# Patient Record
Sex: Female | Born: 1960
Health system: Southern US, Academic
[De-identification: ages and names within clinical notes are randomized; demographics above are authoritative.]

## PROBLEM LIST (undated history)

## (undated) ENCOUNTER — Encounter: Attending: Internal Medicine | Primary: Internal Medicine

## (undated) ENCOUNTER — Ambulatory Visit

## (undated) ENCOUNTER — Ambulatory Visit: Payer: PRIVATE HEALTH INSURANCE | Attending: Rheumatology | Primary: Rheumatology

## (undated) ENCOUNTER — Ambulatory Visit: Payer: PRIVATE HEALTH INSURANCE

## (undated) ENCOUNTER — Encounter: Attending: Rheumatology | Primary: Rheumatology

## (undated) ENCOUNTER — Encounter

## (undated) DIAGNOSIS — M810 Age-related osteoporosis without current pathological fracture: Secondary | ICD-10-CM

## (undated) DIAGNOSIS — M069 Rheumatoid arthritis, unspecified: Secondary | ICD-10-CM

## (undated) DIAGNOSIS — E119 Type 2 diabetes mellitus without complications: Secondary | ICD-10-CM

## (undated) DIAGNOSIS — G629 Polyneuropathy, unspecified: Secondary | ICD-10-CM

---
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
## 2004-04-05 ENCOUNTER — Ambulatory Visit (HOSPITAL_COMMUNITY): Admission: RE | Admit: 2004-04-05 | Discharge: 2004-04-05 | Payer: Self-pay | Admitting: Gynecology

## 2004-04-17 ENCOUNTER — Encounter (INDEPENDENT_AMBULATORY_CARE_PROVIDER_SITE_OTHER): Payer: Self-pay | Admitting: *Deleted

## 2004-04-17 ENCOUNTER — Inpatient Hospital Stay (HOSPITAL_COMMUNITY): Admission: RE | Admit: 2004-04-17 | Discharge: 2004-04-19 | Payer: Self-pay | Admitting: Gynecology

## 2004-05-30 ENCOUNTER — Ambulatory Visit (HOSPITAL_COMMUNITY): Admission: RE | Admit: 2004-05-30 | Discharge: 2004-05-31 | Payer: Self-pay | Admitting: *Deleted

## 2005-03-07 ENCOUNTER — Emergency Department: Payer: Self-pay | Admitting: Emergency Medicine

## 2005-03-08 ENCOUNTER — Ambulatory Visit: Payer: Self-pay | Admitting: Emergency Medicine

## 2007-06-28 ENCOUNTER — Ambulatory Visit: Payer: Self-pay | Admitting: Gynecology

## 2007-06-28 ENCOUNTER — Encounter: Admission: RE | Admit: 2007-06-28 | Discharge: 2007-06-28 | Payer: Self-pay | Admitting: Gynecology

## 2010-02-13 ENCOUNTER — Ambulatory Visit (INDEPENDENT_AMBULATORY_CARE_PROVIDER_SITE_OTHER): Payer: Self-pay | Admitting: Obstetrics & Gynecology

## 2010-02-13 DIAGNOSIS — N939 Abnormal uterine and vaginal bleeding, unspecified: Secondary | ICD-10-CM

## 2010-02-13 DIAGNOSIS — N926 Irregular menstruation, unspecified: Secondary | ICD-10-CM

## 2010-02-13 DIAGNOSIS — N949 Unspecified condition associated with female genital organs and menstrual cycle: Secondary | ICD-10-CM

## 2010-02-14 ENCOUNTER — Encounter (INDEPENDENT_AMBULATORY_CARE_PROVIDER_SITE_OTHER): Payer: Self-pay | Admitting: *Deleted

## 2010-03-29 NOTE — Assessment & Plan Note (Signed)
NAME:  Mary Cobb, Mary Cobb              ACCOUNT NO.:  [id number]  MEDICAL RECORD NO.:  [id number]           PATIENT TYPE:  LOCATION:  [facility] at [facility]           FACILITY:  PHYSICIAN:  Scheryl Darter, MD            DATE OF BIRTH:  DATE OF SERVICE:                                 CLINIC NOTE  HISTORY OF PRESENT ILLNESS:  The patient comes in today because of episode of vaginal bleeding and some pain.  The patient is a 50 year old white female gravida 2, para 2.  Last menstrual period was in 2006 at the time of a total abdominal hysterectomy, right salpingo-oophorectomy, appendectomy, Burch colposuspension.  Procedure was done by Dr. Mia Creek.  The patient states that yesterday she had bleeding like start of her period.  She has had some lower back pain for several days and some suprapubic cramping.  She did not think that this blood had come from her urine and she does not see any blood in her urine.  No sign of any blood in her stool.  No constipation or diarrhea.  No dysuria.  No history of kidney stones.  She has had no abnormal discharge.  She has medical history of Wolff-Parkinson-White syndrome.  MEDICATIONS:  None.  REVIEW OF SYSTEMS:  No bleeding today.  She still reports some cramping and lower back pain.  PHYSICAL EXAMINATION:  GENERAL:  The patient is in no acute distress. VITAL SIGNS:  Weight is 187 pounds, height 5 feet 3 inches, blood pressure 120/88, and pulse 78. ABDOMEN:  Moderately obese, soft, and nontender.  No mass. PELVIC:  She has no CVA tenderness. PELVIC:  External genitalia, she has mild atrophy.  She has erythema at the urethral meatus consistent with urethral prolapse.  There is minimal bulging of her bladder consistent with mild cystocele.  The cuff is well- supported and there are no lesions and no blood.  No pelvic masses or tenderness.  IMPRESSION:  Vaginal bleeding which may have been urinary in origin.  I did not find any vaginal source.  The  patient has had a hysterectomy.  PLAN:  We will obtain urine culture today.  She was seen at the Urgent Care yesterday.  She was told there was some blood in her urine, but she was told that this was normal for woman.  Like to evaluate her urinary tract, and possibility of kidney stones.  She ordered a CT urogram.  She will return after this has been performed.  She will report if she has more severe symptoms.     Scheryl Darter, MD    JA/MEDQ  D:  02/13/2010  T:  02/14/2010  Job:  [id number]

## 2010-05-21 NOTE — Assessment & Plan Note (Signed)
Mary Cobb, BOSCH              ACCOUNT NO.:  [id number]   MEDICAL RECORD NO.:  [id number]          PATIENT TYPE:  POB   LOCATION:  [facility] at [facility]         FACILITY:  [facility]   PHYSICIAN:  Ginger Carne, MD DATE OF BIRTH:  [birth year]   DATE OF SERVICE:                                  CLINIC NOTE   The patient returns today for routine gynecological evaluation.  She had  undergone a total abdominal hysterectomy, right salpingo-oophorectomy,  appendectomy, and a Burch colposuspension with preservation of left tube  and ovary in April 2006 because of chronic pelvic pain, endometriosis,  and genuine urinary stress incontinence.  The patient denies complaints  of pelvic pain or urinary stress incontinence symptomatology.  No  complaints of urgency or an overactive bladder.  The patient otherwise  denies any cardiovascular, genitourinary, or gastrointestinal  symptomatology.  Her past history is per office notes.   PHYSICAL EXAMINATION:  VITAL SIGNS:  Blood pressure 106/70, weight 187  pounds, and height 5 feet 3 inches.  HEENT:  Grossly normal.  BREASTS:  Without masses, discharge, thickenings, or tenderness.  CHEST:  Clear to percussion and auscultation.  CARDIOVASCULAR:  Without murmurs or enlargements.  EXTREMITIES:  Within normal limits.  LYMPHATICS:  Within normal limits.  SKIN:  Within normal limits.  NEUROLOGIC:  Within normal limits.  MUSCULOSKELETAL:  Within normal limits.  ABDOMEN:  Soft without gross hepatosplenomegaly.  PELVIC:  External genitalia; vulva and vagina are normal.  Cuff smooth.  Pap smear deferred.  Adnexa and bimanual exam revealed no tenderness or  masses.   IMPRESSION:  Normal gynecologic exam.   PLAN:  The patient had a mammogram this morning and at this time, blood  work will be deferred.  In addition, it should be noted the patient had  in the past been treated for hypertension.  At this time is currently on  no medications.     ______________________________  Ginger Carne, MD     SHB/MEDQ  D:  06/28/2007  T:  06/29/2007  Job:  [id number]

## 2010-05-24 NOTE — Discharge Summary (Signed)
Mary Cobb, Mary Cobb              ACCOUNT NO.:  [id number]   MEDICAL RECORD NO.:  [id number]          PATIENT TYPE:  INP   LOCATION:  9316                          FACILITY:  [facility]   PHYSICIAN:  Ginger Carne, MD  DATE OF BIRTH:  1960/07/09   DATE OF ADMISSION:  04/17/2004  DATE OF DISCHARGE:  04/19/2004                                 DISCHARGE SUMMARY   REASON FOR ADMISSION:  Chronic pelvic pain, endometriosis and genuine  urinary stress incontinence.   FINAL DIAGNOSES:  Stage 2 endometriosis, chronic pelvic pain and genuine  urinary stress incontinence.   IN HOSPITAL PROCEDURES:  Total abdominal hysterectomy, right salpingo-  oophorectomy, appendectomy, Burch colposuspension with preservation of left  tube and ovary.   HOSPITAL COURSE:  This patient is a 50 year old multiparous Caucasian female  who underwent the aforementioned procedures on the 12th of April 2006. The  patient's preoperative evaluation demonstrated Wolf-Parkinson-White syndrome  and was managed by Dr. Severiano Gilbert for cardiologic evaluation. Prior to her  surgery, a conference call with anesthesia myself confirmed the safety to  proceed with said surgery and the reassurance that there was less than a 1%  risk of patient developing a malignant or dangerous rhythm pattern. All  parties were comfortable to proceed with surgery. Intraoperatively, the  patient did well without evidence for heart rate abnormalities. Similarly  her postoperative course revealed no evidence of palpitations, chest pain or  heart rate abnormalities.   The patient's postoperative hemoglobin was 11.0 from a preop of 15.5 and  hematocrit of 31.3 from a preop of 43.8. Her calves were without tenderness  bilaterally, her incision was dry without drainage and her lungs were clear.  Her heart demonstrated no abnormal rate or rhythm patterns of concern. Her  Foley catheter was removed and at the time of this dictation she had voided  once and  will be followed for 2-3 more hours to assure that she has complete  voiding.   The patient's postoperative medications include:  1.  Percocet 5/325, 1-2 every 4-6 hours.  2.  Augmentin 500 mg twice a day.  3.  She will continue her home medications including Crestor 10 mg daily and      Hyzaar 50/12.5 mg daily.   She was explained to utilize timed voids every four hours for four weeks to  assure complete bladder emptying. She was advised to contact the office if  she has a temperature elevation above 100.4 degrees, incisional drainage,  redness or increasing pain, constipation that is not relieved with Biscadil  and/or magnesium citrate, increasing vaginal drainage, increasing pelvic or  abdominal pain or lower back pain or burning with urination. The patient  will return  to the office in six days to have her staples removed. All questions were  answered to the satisfaction of said patient. She was advised not to lift  more than 20 pounds for the next six weeks and to resume a regular diet. Her  status on discharge was excellent.      SHB/MEDQ  D:  04/19/2004  T:  04/19/2004  Job:  [id number]

## 2010-05-24 NOTE — H&P (Signed)
Mary Cobb, LIPPARD              ACCOUNT NO.:  [id number]   MEDICAL RECORD NO.:  [id number]          PATIENT TYPE:  INP   LOCATION:  NA                            FACILITY:  [facility]   PHYSICIAN:  Ginger Carne, MD  DATE OF BIRTH:  1960-05-16   DATE OF ADMISSION:  DATE OF DISCHARGE:                                HISTORY & PHYSICAL   DATE OF SCHEDULED SURGERY:  April 05, 2004   ADMITTING DIAGNOSIS:  Endometriosis, chronic pain, and genuine urinary  stress incontinence.   IN-HOSPITAL PROCEDURE:  Laparoscopic-assisted vaginal hysterectomy with  unilateral or bilateral salpingo-oophorectomy, tension-free vaginal tape  procedure, and cystoscopy.   HISTORY OF PRESENT ILLNESS:  This patient is a 50 year old gravida 2 para 2-  0-0-2 Caucasian female admitted for the aforementioned diagnoses. The  patient has had a several-year history of worsening pain with intercourse,  pelvic discomfort between her menses, as well as significant dysmenorrhea.  The patient states that the discomfort seems to not be abated by  nonsteroidal antiinflammatory agents. Her primary care physician has  utilized both narcotic analgesics as well as management with oral  contraceptives, but continues to have pain despite these methods. She has  been on continual oral contraceptives for 1 year without benefit. She has  had a laparoscopy in 1984 which at that time was told that she did have  endometriosis, although the staging is not available. This was performed at  the time that she had her laparoscopic tubal ligation.   The patient has no genitourinary, gastrointestinal, or musculoskeletal  sources for her pain. In addition, urinalysis is normal. The patient had  declined the use of Lupron Depot, Depo-Provera, Danocrine for management of  said discomfort medically.   Her menses are anywhere from 30 to 15 days apart, lasting 1 to 2-and-a-half  weeks at times. Transvaginal ultrasound revealed no evidence for  intracavitary lesions on a hysterosonogram. No evidence for fibroids noted  as well. Endometrial biopsy was negative for neoplasia or hyperplasia.   The patient loses urine with coughing, straining, and other Valsalva  maneuvers. Her history is negative for postvoid dribbling, nocturia,  increased frequency of urination, or straining to void. The patient has had  no previous bladder or urological surgery. The patient takes no medications  to enhance her loss of urine and has no chronic diseases that would  contribute to same. The patient denies fecal incontinence.   OBSTETRICAL AND GYNECOLOGICAL HISTORY:  The patient has had two vaginal  deliveries in 1980 and 1982 and a tubal ligation in 1984 by laparoscopic  approach. She has had cryosurgery in 1984 for dysplasia.   MEDICAL HISTORY:  Includes hypercholesterolemia and hypertension.   MEDICATIONS:  1.  Crestor 10 mg daily.  2.  Hyzaar 50/12.5 mg daily.   ALLERGIES:  None.   SURGICAL HISTORY:  Negative.   SOCIAL HISTORY:  Positive for smoking one-and-a-half packs of cigarettes  daily but denies alcohol or illicit drug abuse.   REVIEW OF SYSTEMS:  Negative.   FAMILY HISTORY:  Her father has hypertension and her mother has type 2  diabetes.  PHYSICAL EXAMINATION:  GENERAL:  This is a pleasant female in no acute  distress.  VITAL SIGNS:  Blood pressure is 122/80, height 5 feet 3 inches, weight 179  pounds.  HEENT:  Grossly normal.  CHEST:  Clear.  CARDIAC:  Without murmurs or enlargements.  BREASTS:  Without masses, discharge, thickenings, or tenderness.  CHEST:  Clear to percussion and auscultation.  CARDIOVASCULAR:  Without murmurs or enlargements, regular rate and rhythm.  EXTREMITY, LYMPHATIC, SKIN, NEUROLOGIC, MUSCULOSKELETAL:  All within normal  limits.  ABDOMEN:  Soft without gross hepatosplenomegaly.  PELVIC:  External genitalia, vulva, and vagina are normal. Cervix smooth  without erosions or lesions. There is  tenderness on palpation of the uterus,  which is normal is size. Both adnexa are tender.  RECTAL:  Hemoccult negative without masses.   Urinalysis is normal. The patient visibly demonstrates loss of urine with  coughing, straining on the table. The patient's in-office filling cystometry  revealed no evidence for an overactive bladder. Urinalysis is normal.   IMPRESSION:  1.  Chronic pelvic pain, dyspareunia, dysmenorrhea, consistent with      endometriosis.  2.  Genuine urinary stress incontinence.   PLAN:  1.  The patient was apprised of both medical and surgical options related to      the above procedures. The patient does not wish to partake in any      medical management for her pain. She has no desire for further      childbearing and feels that the pain has had a negative impact in her      quality of life. Her sex life has been significantly affected, the      patient states, as well. Ashby Dawes of said procedure discussed in detail.      She understands that it is possible that both ovaries will be removed if      warranted on the basis of visible endometriosis or significant adhesive      disease. However, if this is not necessary a laparoscopic-assisted      vaginal hysterectomy and a right salpingo-oophorectomy with preservation      left tube and ovary will be performed. An incidental appendectomy will      possibly also be performed as well. Risks including injuries to ureter,      bowel, and bladder; possible conversion to an open procedure; hemorrhage      possibly requiring a blood transfusion; infection; and unforeseen      complications were discussed and understood by said patient.  2.  The patient was fully apprised as to the nature of a tension-free      vaginal tape procedure. She had tried Kegel      maneuvers without benefit. She understands that the risks of the     procedure includes possible postoperative urinary retention, urgency,      failure of said  procedure to correct incontinence, bleeding, infection,      and possible graft rejection. All questions answered to the satisfaction      of said patient.      SHB/MEDQ  D:  04/03/2004  T:  04/03/2004  Job:  [id number]

## 2010-05-24 NOTE — Op Note (Signed)
Mary Cobb, Mary Cobb              ACCOUNT NO.:  [id number]   MEDICAL RECORD NO.:  [id number]         PATIENT TYPE:  INP   LOCATION:  9399                          FACILITY:  [facility]   PHYSICIAN:  Ginger Carne, MD  DATE OF BIRTH:  [birth year]   DATE OF PROCEDURE:  04/17/2004  DATE OF DISCHARGE:                                 OPERATIVE REPORT   PREOPERATIVE DIAGNOSES:  1.  Endometriosis.  2.  Chronic pelvic pain.  3.  Genuine urinary stress incontinence.   POSTOPERATIVE DIAGNOSES:  1.  Stage II endometriosis.  2.  Chronic pelvic pain.  3.  Genuine urinary stress incontinence.   PROCEDURE:  1.  Total abdominal hysterectomy.  2.  Right salpingo-oophorectomy.  3.  Appendectomy.  4.  Burch colposuspension.   ASSISTANT:  None.   COMPLICATIONS:  None immediate.   ESTIMATED BLOOD LOSS:  200 to 250 cc.   ANESTHESIA:  General.   SPECIMENS:  1.  Uterus.  2.  Cervix.  3.  Right tube and ovary.  4.  Appendix.   OPERATIVE FINDINGS:  The patient demonstrated stage II endometriosis with  adhesive disease and endometriotic lesions on the right uterosacral  ligament, right tube and ovary with adhesions of the right tube and ovary to  its respective side wall.  Left tube and ovary were relatively free of  adhesive disease and free of endometriotic nodules and, therefore, left in  situ.  The lesions of endometriosis were also noted on the posterior aspect  of the cervix and uterus and were excised.  The appendix appeared to be  normal and was removed to avoid difficulties in differential diagnosis in  the future of pelvic pain related to endometriosis.  The Burch  colposuspension was unremarkable, and due to time constraints to avoid any  difficulties with the patient's heart rate, it was deemed appropriate to  bypass a cystoscopy at that time.   OPERATIVE PROCEDURE:  The patient was prepped and draped in the usual  fashion and placed in the lithotomy position.  Betadine solution  used for  antiseptic and the patient was catheterized prior to the procedure.  After  adequate general anesthesia, a Pfannenstiel incision was made and the  abdomen opened.  Appropriate packing was then followed by identification of  the ureters bilaterally.  Round ligaments were clamped, cut, and ligated  with 0 Vicryl suture.  This then extended to the anterior and posterior  peritoneal leaf dissection.  The right infundibulopelvic ligament and the  left utero-ovarian ligaments were clamped, cut, and ligated with  transfixation of 0 Vicryl sutures twice.  In the standard [place]  fashion, the uterine vasculature was clamped, cut, and ligated with 0 Vicryl  suture.  This extended to the junction of the cervix and the vagina whereby  the corpus and cervix were removed from the vaginal cuff.  The cuff was then  closed through the angles with 0 Vicryl running interlocking suture.  Bleeding points hemostatically checked and copious irrigation in the region  was performed.   After isolating said field, the appendectomy was performed by clamping,  cutting,  and ligating the mesoappendix to the base.  The base was then tied  with 0 Vicryl suture twice.  Afterwards, the appendix was severed above the  base ties twice and the specimen sent for pathology.  Irrigation of the site  then followed, including cleansing the tip of the base with Betadine.  Again, more irrigation followed, bleeding points hemostatically checked,  irrigant removed, closure of the parietal peritoneum with 2-0 Vicryl suture.  At this point, repositioning and placement for the Burch colposuspension  followed.   With two fingers in the vaginal canal and the Foley between the second and  third digits, the space of Retzius was dissected bilaterally.  Prolene  sutures, 2-0, were placed 1 to 2 cm respectively lateral to the urethra  midline and affixed to their respective lacunar ligaments.  Appropriate  tensioning followed.   Afterwards, irrigation with lactated Ringer's  followed.  Bleeding points hemostatically checked.  This was then followed  by closure of the fascia with 0 PDS running suture and skin staples for the  skin.  Instrument and sponge count was correct.  The patient tolerated the  procedure well and returned to the postanesthesia recovery room in excellent  condition.  Estimated blood loss was 200 to 250 cc.  Urine clear at the end  of the procedure.      SHB/MEDQ  D:  04/17/2004  T:  04/17/2004  Job:  [id number]
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
## 2010-05-24 NOTE — H&P (Signed)
Mary Cobb, Mary Cobb              ACCOUNT NO.:  [id number]   MEDICAL RECORD NO.:  [id number]          PATIENT TYPE:  INP   LOCATION:  NA                            FACILITY:  [facility]   PHYSICIAN:  Ginger Carne, MD  DATE OF BIRTH:  1960/03/03   DATE OF ADMISSION:  DATE OF DISCHARGE:                                HISTORY & PHYSICAL   ADDENDUM:  This is an addendum note to a dictated history and physical  dictation of April 03, 2004.  The patient was placed under anesthesia on  April 05, 2004, for aforementioned procedures and was noted to have an  abnormal heart rate and rhythm pattern.  The procedure was cancelled and the  patient was referred to [facility] E. Severiano Gilbert, M.D., for cardiac evaluation.  She  was noted to have Wolff-Parkinson-White syndrome, nature of said findings  did not preclude rescheduling said surgery, and she was cleared for said  procedure.  The patient has no change in her condition or status since the  dictated note from April 03, 2004. New pre-operative laboratory work has  been ordered and clearance letter from cardiology forwarded to anaesthesia  department.      SHB/MEDQ  D:  04/16/2004  T:  04/16/2004  Job:  [id number]

## 2012-06-12 ENCOUNTER — Emergency Department: Payer: Self-pay | Admitting: Emergency Medicine

## 2013-11-30 ENCOUNTER — Emergency Department: Payer: Self-pay | Admitting: Student

## 2016-01-05 ENCOUNTER — Emergency Department
Admission: EM | Admit: 2016-01-05 | Discharge: 2016-01-05 | Disposition: A | Discharge status: Home / Self Care | Payer: Managed Care, Other (non HMO) | Attending: Student in an Organized Health Care Education/Training Program | Admitting: Student in an Organized Health Care Education/Training Program

## 2016-01-05 ENCOUNTER — Emergency Department: Payer: Managed Care, Other (non HMO)

## 2016-01-05 DIAGNOSIS — Y929 Unspecified place or not applicable: Secondary | ICD-10-CM | POA: Diagnosis not present

## 2016-01-05 DIAGNOSIS — W1839XA Other fall on same level, initial encounter: Secondary | ICD-10-CM | POA: Diagnosis not present

## 2016-01-05 DIAGNOSIS — Y999 Unspecified external cause status: Secondary | ICD-10-CM | POA: Insufficient documentation

## 2016-01-05 DIAGNOSIS — S20211A Contusion of right front wall of thorax, initial encounter: Secondary | ICD-10-CM

## 2016-01-05 DIAGNOSIS — E119 Type 2 diabetes mellitus without complications: Secondary | ICD-10-CM | POA: Insufficient documentation

## 2016-01-05 DIAGNOSIS — Y939 Activity, unspecified: Secondary | ICD-10-CM | POA: Diagnosis not present

## 2016-01-05 DIAGNOSIS — S20221A Contusion of right back wall of thorax, initial encounter: Secondary | ICD-10-CM | POA: Insufficient documentation

## 2016-01-05 DIAGNOSIS — S3992XA Unspecified injury of lower back, initial encounter: Secondary | ICD-10-CM | POA: Diagnosis present

## 2016-01-05 HISTORY — DX: Type 2 diabetes mellitus without complications: E11.9

## 2016-01-05 HISTORY — DX: Rheumatoid arthritis, unspecified: M06.9

## 2016-01-05 MED ORDER — HYDROCODONE-ACETAMINOPHEN 5-325 MG PO TABS: 1.0000 | ORAL_TABLET | ORAL | 0 refills | Status: DC | PRN

## 2016-01-05 NOTE — ED Notes (Signed)
Pt able to ambulate but reports that it is very "slow".

## 2016-01-05 NOTE — Discharge Instructions (Signed)
Take Norco as needed for pain every 4-6 hours. You may also take ibuprofen with this medication when needed. Ice to your back and ribs as needed for pain. Do not take pain medication and drive or operate machinery. Be aware that this medication could cause drowsiness increase your risk for falling. Follow-up with your  primary care doctor or [facility] acute-care if any continued problems.

## 2016-01-05 NOTE — ED Notes (Signed)
Pt alert and oriented X4, active, cooperative, pt in NAD. RR even and unlabored, color WNL.  Pt informed to return if any life threatening symptoms occur.   

## 2016-01-05 NOTE — ED Provider Notes (Signed)
[facility] Emergency Department Provider Note   ____________________________________________   First MD Initiated Contact with Patient 01/05/16 1211     (approximate)  I have reviewed the triage vital signs and the nursing notes.   HISTORY  Chief Complaint Back Pain   HPI Mary Cobb is a 55 y.o. female is here with complaint of right lower back pain after falling backwards into the countertop in the kitchen last evening. Patient states that there was no head injury or loss of consciousness. She states that this morning her back hurts worse with movement and with deep inspiration. She denies any previous injury to her back. Currently she rates her pain is 7 out of 10.   Past Medical History:  Diagnosis Date  . Diabetes mellitus without complication (HCC)   . Rheumatoid arthritis (HCC)     There are no active problems to display for this patient.   History reviewed. No pertinent surgical history.  Prior to Admission medications   Medication Sig Start Date End Date Taking? Authorizing Provider  aspirin EC 81 MG tablet Take 81 mg by mouth daily.   Yes Historical Provider, MD  atorvastatin (LIPITOR) 10 MG tablet Take 10 mg by mouth daily.   Yes Historical Provider, MD  folic acid (FOLVITE) 1 MG tablet Take 1 mg by mouth daily.   Yes Historical Provider, MD  hydroxychloroquine (PLAQUENIL) 200 MG tablet Take 400 mg by mouth daily.   Yes Historical Provider, MD  metFORMIN (GLUCOPHAGE) 500 MG tablet Take 500 mg by mouth 2 (two) times daily with a meal.   Yes Historical Provider, MD  methotrexate 2.5 MG tablet Take 2.5 mg by mouth 3 (three) times a week.   Yes Historical Provider, MD  predniSONE (DELTASONE) 2.5 MG tablet Take 2.5 mg by mouth daily with breakfast.   Yes Historical Provider, MD  HYDROcodone-acetaminophen (NORCO/VICODIN) 5-325 MG tablet Take 1 tablet by mouth every 4 (four) hours as needed for moderate pain. 01/05/16   Mary Rumpshonda L Luevenia Mcavoy,  PA-C    Allergies Patient has no known allergies.  No family history on file.  Social History Social History  Substance Use Topics  . Smoking status: Never Smoker  . Smokeless tobacco: Not on file  . Alcohol use No    Review of Systems Constitutional: No fever/chills Eyes: No visual changes. ENT: No trauma Cardiovascular: Denies chest pain.  Respiratory: Denies shortness of breath. Gastrointestinal: No abdominal pain.  No nausea, no vomiting.  Musculoskeletal: Positive for back pain. Skin: Negative for rash. Neurological: Negative for headaches, focal weakness or numbness.  10-point ROS otherwise negative.  ____________________________________________   PHYSICAL EXAM:  VITAL SIGNS: ED Triage Vitals [01/05/16 1121]  Enc Vitals Group     BP 136/71     Pulse Rate 100     Resp 16     Temp 98.5 F (36.9 C)     Temp Source Oral     SpO2 100 %     Weight 160 lb (72.6 kg)     Height      Head Circumference      Peak Flow      Pain Score 7     Pain Loc      Pain Edu?      Excl. in GC?     Constitutional: Alert and oriented. Well appearing and in no acute distress. Eyes: Conjunctivae are normal. PERRL. EOMI. Head: Atraumatic. Nose: No congestion/rhinnorhea. Neck: No stridor.  No cervical tenderness on palpation  posteriorly. Range of motion is without restriction and without pain. Cardiovascular: Normal rate, regular rhythm. Grossly normal heart sounds.  Good peripheral circulation. Respiratory: Normal respiratory effort.  No retractions. Lungs CTAB. Gastrointestinal: Soft and nontender. No distention. Musculoskeletal: On examination of the back there is no gross deformity and no tenderness on palpation of the cervical, thoracic or lumbar spine. There is tenderness however on the right posterior lower ribs. There is no gross deformity noted no soft tissue swelling present. No ecchymosis or abrasions are seen. There is tenderness on palpation of the posterior 10th,  11th, and 12th rib area. Neurologic:  Normal speech and language. No gross focal neurologic deficits are appreciated. No gait instability. Skin:  Skin is warm, dry and intact. No ecchymosis or abrasions are seen. Psychiatric: Mood and affect are normal. Speech and behavior are normal.  ____________________________________________   LABS (all labs ordered are listed, but only abnormal results are displayed)  Labs Reviewed - No data to display  RADIOLOGY Right rib x-ray per radiologist: IMPRESSION:  1. No rib fracture or rib lesion.  2. No acute cardiopulmonary disease.   I, Mary Rumpshonda L Sahir Tolson, personally viewed and evaluated these images (plain radiographs) as part of my medical decision making, as well as reviewing the written report by the radiologist.   ____________________________________________   PROCEDURES  Procedure(s) performed: None  Procedures  Critical Care performed: No  ____________________________________________   INITIAL IMPRESSION / ASSESSMENT AND PLAN / ED COURSE  Pertinent labs & imaging results that were available during my care of the patient were reviewed by me and considered in my medical decision making (see chart for details).    Clinical Course    Patient was reassured that there was no fractures. Prior to x-ray patient was given hydrocodone which she states has taken edge off her pain. Patient was given a prescription for the same to be taken at home. She is also given a note for work stating that she could not take pain medication and drive or operate machinery. Patient states she has a meeting to attend on Thursday and would like this note to give to her company.  ____________________________________________   FINAL CLINICAL IMPRESSION(S) / ED DIAGNOSES  Final diagnoses:  Contusion, back, right, initial encounter  Rib contusion, right, initial encounter      NEW MEDICATIONS STARTED DURING THIS VISIT:  Discharge Medication List as  of 01/05/2016  2:09 PM    START taking these medications   Details  HYDROcodone-acetaminophen (NORCO/VICODIN) 5-325 MG tablet Take 1 tablet by mouth every 4 (four) hours as needed for moderate pain., Starting Sat 01/05/2016, Print         Note:  This document was prepared using Dragon voice recognition software and may include unintentional dictation errors.    Mary Rumpshonda L Shateria Paternostro, PA-C 01/05/16 1539    Willy EddyPatrick Robinson, MD 01/05/16 [phone]

## 2016-01-05 NOTE — ED Notes (Signed)
See triage note   states she fell against counter  Having pain to right lower back

## 2016-01-05 NOTE — ED Triage Notes (Signed)
Pt ran into countertop last night with lower back. Pt able to ambulate since, pain worsening with movemeent. Only complaint is lower back pain.

## 2016-05-22 ENCOUNTER — Encounter: Payer: Self-pay | Admitting: Emergency Medicine

## 2016-05-22 ENCOUNTER — Emergency Department: Payer: Managed Care, Other (non HMO)

## 2016-05-22 ENCOUNTER — Emergency Department
Admission: EM | Admit: 2016-05-22 | Discharge: 2016-05-22 | Disposition: A | Discharge status: Home / Self Care | Payer: Managed Care, Other (non HMO) | Attending: Emergency Medicine | Admitting: Emergency Medicine

## 2016-05-22 DIAGNOSIS — Z7982 Long term (current) use of aspirin: Secondary | ICD-10-CM | POA: Diagnosis not present

## 2016-05-22 DIAGNOSIS — F17200 Nicotine dependence, unspecified, uncomplicated: Secondary | ICD-10-CM | POA: Insufficient documentation

## 2016-05-22 DIAGNOSIS — E119 Type 2 diabetes mellitus without complications: Secondary | ICD-10-CM | POA: Insufficient documentation

## 2016-05-22 DIAGNOSIS — Z7984 Long term (current) use of oral hypoglycemic drugs: Secondary | ICD-10-CM | POA: Insufficient documentation

## 2016-05-22 DIAGNOSIS — B029 Zoster without complications: Secondary | ICD-10-CM | POA: Diagnosis not present

## 2016-05-22 DIAGNOSIS — M549 Dorsalgia, unspecified: Secondary | ICD-10-CM | POA: Diagnosis present

## 2016-05-22 DIAGNOSIS — Z79899 Other long term (current) drug therapy: Secondary | ICD-10-CM | POA: Insufficient documentation

## 2016-05-22 HISTORY — DX: Polyneuropathy, unspecified: G62.9

## 2016-05-22 HISTORY — DX: Age-related osteoporosis without current pathological fracture: M81.0

## 2016-05-22 MED ORDER — HYDROCODONE-ACETAMINOPHEN 5-325 MG PO TABS: 1.0000 | ORAL_TABLET | Freq: Four times a day (QID) | ORAL | 0 refills | Status: AC | PRN

## 2016-05-22 MED ORDER — GABAPENTIN 300 MG PO CAPS: 300.0000 mg | ORAL_CAPSULE | Freq: Three times a day (TID) | ORAL | 0 refills | Status: AC

## 2016-05-22 MED ORDER — ACYCLOVIR 400 MG PO TABS: 800.0000 mg | ORAL_TABLET | Freq: Every day | ORAL | 0 refills | Status: AC

## 2016-05-22 MED ORDER — HYDROCODONE-ACETAMINOPHEN 5-325 MG PO TABS
1.0000 | ORAL_TABLET | Freq: Once | ORAL | Status: AC
  Administered 2016-05-22: 1 via ORAL
  Filled 2016-05-22: qty 1

## 2016-05-22 MED ORDER — IBUPROFEN 400 MG PO TABS
600.0000 mg | ORAL_TABLET | Freq: Once | ORAL | Status: AC
  Administered 2016-05-22: 600 mg via ORAL
  Filled 2016-05-22: qty 2

## 2016-05-22 MED ORDER — GABAPENTIN 300 MG PO CAPS
900.0000 mg | ORAL_CAPSULE | Freq: Once | ORAL | Status: AC
  Administered 2016-05-22: 900 mg via ORAL
  Filled 2016-05-22: qty 3

## 2016-05-22 NOTE — ED Notes (Signed)
Patient reports back pain x2 months. Reports recently having a burning sensation in back radiating to stomach. Denies urinary symptoms. Reports intermittent nausea x 2 months.

## 2016-05-22 NOTE — ED Notes (Signed)
Pt states the pain is in the low left back (x 2 weeks)  that now radiates into her mid abd (x 4 days)

## 2016-05-22 NOTE — ED Triage Notes (Signed)
Pt states back pain x 2-3 weeks in which she saw a chiropractor for 2 weeks but now also has abd pain "burning" x 4 days.

## 2016-05-22 NOTE — Discharge Instructions (Signed)
It is possible that your pain is related to herpes zoster that has never developed a rash. Please take all of your medications as prescribed and follow-up with your primary care physician and your rheumatologist as scheduled. Return to the emergency department sooner for any new or worsening symptoms.  It was a pleasure to take care of you today, and thank you for coming to our emergency department.  If you have any questions or concerns before leaving please ask the nurse to grab me and I'm more than happy to go through your aftercare instructions again.  If you were prescribed any opioid pain medication today such as Norco, Vicodin, Percocet, morphine, hydrocodone, or oxycodone please make sure you do not drive when you are taking this medication as it can alter your ability to drive safely.  If you have any concerns once you are home that you are not improving or are in fact getting worse before you can make it to your follow-up appointment, please do not hesitate to call [phone] and come back for further evaluation.  Merrily BrittleNeil Iness Pangilinan MD  No results found for this or any previous visit. Dg Chest 2 View  Result Date: 05/22/2016 CLINICAL DATA:  Patient reports back pain x2 months. Reports recently having a burning sensation in back radiating to stomach. Denies urinary symptoms. Reports intermittent nausea x 2 months EXAM: CHEST  2 VIEW COMPARISON:  01/05/2016 FINDINGS: The cardiac silhouette is normal in size and configuration. No mediastinal or hilar masses. No evidence of adenopathy. The lungs are clear.  No pleural effusion or pneumothorax. Skeletal structures are intact. IMPRESSION: No active cardiopulmonary disease. Electronically Signed   By: Amie Portlandavid  Ormond M.D.   On: 05/22/2016 17:39

## 2016-05-22 NOTE — ED Provider Notes (Signed)
[facility] Emergency Department Provider Note  ____________________________________________   First MD Initiated Contact with Patient 05/22/16 1708     (approximate)  I have reviewed the triage vital signs and the nursing notes.   HISTORY  Chief Complaint Back Pain and Abdominal Pain    HPI Mary Cobb is a 56 y.o. female who comes to the emergency departmentwith sharp burning aching left sided back pain radiating around like a band just under her left breast for the past month or so. He is been evaluated by a [facility] ago who felt that this was musculoskeletal and prescribed her Voltaren gel which she has not begun to use. She comes to the emergency department today for a second opinion. She denies trauma. She denies rash. She denies fevers or chills. She denies nausea or vomiting.   Past Medical History:  Diagnosis Date  . Diabetes mellitus without complication (HCC)   . Neuropathy   . Osteoporosis   . Rheumatoid arthritis (HCC)     There are no active problems to display for this patient.   History reviewed. No pertinent surgical history.  Prior to Admission medications   Medication Sig Start Date End Date Taking? Authorizing Provider  acyclovir (ZOVIRAX) 400 MG tablet Take 2 tablets (800 mg total) by mouth 5 (five) times daily. 05/22/16 05/29/16  Merrily Brittleifenbark, Yue Flanigan, MD  aspirin EC 81 MG tablet Take 81 mg by mouth daily.    [provider]  atorvastatin (LIPITOR) 10 MG tablet Take 10 mg by mouth daily.    [provider]  folic acid (FOLVITE) 1 MG tablet Take 1 mg by mouth daily.    [provider]  gabapentin (NEURONTIN) 300 MG capsule Take 1 capsule (300 mg total) by mouth 3 (three) times daily. 05/22/16 05/22/17  Merrily Brittleifenbark, Maudine Kluesner, MD  HYDROcodone-acetaminophen (NORCO) 5-325 MG tablet Take 1 tablet by mouth every 6 (six) hours as needed for severe pain. 05/22/16   Merrily Brittleifenbark, Phylliss Strege, MD    hydroxychloroquine (PLAQUENIL) 200 MG tablet Take 400 mg by mouth daily.    [provider]  metFORMIN (GLUCOPHAGE) 500 MG tablet Take 500 mg by mouth 2 (two) times daily with a meal.    [provider]  methotrexate 2.5 MG tablet Take 2.5 mg by mouth 3 (three) times a week.    [provider]  predniSONE (DELTASONE) 2.5 MG tablet Take 2.5 mg by mouth daily with breakfast.    [provider]    Allergies Patient has no known allergies.  History reviewed. No pertinent family history.  Social History Social History  Substance Use Topics  . Smoking status: Current Every Day Smoker    Packs/day: 1.50  . Smokeless tobacco: Never Used  . Alcohol use No    Review of Systems Constitutional: No fever/chills ENT: No sore throat. Cardiovascular: Positive chest pain. Respiratory: Denies shortness of breath. Gastrointestinal: No abdominal pain.  No nausea, no vomiting.  No diarrhea.  No constipation. Musculoskeletal: Positive for back pain. Neurological: Negative for headaches   ____________________________________________   PHYSICAL EXAM:  VITAL SIGNS: ED Triage Vitals  Enc Vitals Group     BP 05/22/16 1549 (!) 155/92     Pulse Rate 05/22/16 1549 92     Resp 05/22/16 1549 16     Temp 05/22/16 1549 98.4 F (36.9 C)     Temp Source 05/22/16 1549 Oral     SpO2 05/22/16 1549 99 %  Weight 05/22/16 1550 146 lb (66.2 kg)     Height 05/22/16 1550 5\' 10"  (1.778 m)     Head Circumference --      Peak Flow --      Pain Score 05/22/16 1549 8     Pain Loc --      Pain Edu? --      Excl. in GC? --     Constitutional: Alert and oriented x 4 Appears somewhat uncomfortable Head: Atraumatic. Nose: No congestion/rhinnorhea. Mouth/Throat: No trismus Neck: No stridor.   Cardiovascular: Regular rate and rhythm no murmurs Respiratory: Normal respiratory effort.  No retractions. Gastrointestinal: Soft nontender Neurologic:  Normal speech and  language. No gross focal neurologic deficits are appreciated.  Skin:  Skin is warm, dry and intact. No rash noted.    ____________________________________________  LABS (all labs ordered are listed, but only abnormal results are displayed)  Labs Reviewed  VARICELLA-ZOSTER BY PCR  VARICELLA ZOSTER ANTIBODY, IGG     __________________________________________  EKG  ED ECG REPORT I, Merrily Brittle, the attending physician, personally viewed and interpreted this ECG.  Date: 05/23/2016 Rate: 78 Rhythm: normal sinus rhythm QRS Axis: normal Intervals: normal ST/T Wave abnormalities: normal Conduction Disturbances: none Narrative Interpretation: unremarkable  ____________________________________________  RADIOLOGY  Chest x-ray with no acute disease ____________________________________________   PROCEDURES  Procedure(s) performed: no  Procedures  Critical Care performed: no  Observation: no ____________________________________________   INITIAL IMPRESSION / ASSESSMENT AND PLAN / ED COURSE  Pertinent labs & imaging results that were available during my care of the patient were reviewed by me and considered in my medical decision making (see chart for details).  The patient has a single band of left back pain radiating across around to under her left breast and T5 distribution not crossing midline. She has no rash whatsoever but her history is consistent with a single dermatomal pain which could represent zoster sine herpete. She has no red flags for back pain. At this point I think it is reasonable to treat her with a course of antivirals and refer her back to primary care.      ____________________________________________   FINAL CLINICAL IMPRESSION(S) / ED DIAGNOSES  Final diagnoses:  Herpes zoster without complication      NEW MEDICATIONS STARTED DURING THIS VISIT:  Discharge Medication List as of 05/22/2016  6:13 PM    START taking these  medications   Details  acyclovir (ZOVIRAX) 400 MG tablet Take 2 tablets (800 mg total) by mouth 5 (five) times daily., Starting Thu 05/22/2016, Until Thu 05/29/2016, Print    gabapentin (NEURONTIN) 300 MG capsule Take 1 capsule (300 mg total) by mouth 3 (three) times daily., Starting Thu 05/22/2016, Until Fri 05/22/2017, Print         Note:  This document was prepared using Dragon voice recognition software and may include unintentional dictation errors.      Merrily Brittle, MD 05/23/16 1247

## 2016-05-23 ENCOUNTER — Telehealth: Payer: Self-pay | Admitting: Emergency Medicine

## 2016-05-23 NOTE — Telephone Encounter (Addendum)
Called patient to inform her that the varicella zoster tests could not be completed due to blood in wrong tube.  Would like to advise patient to follow up with pcp for further testing.  Left her a message asking her to call me.  Patient called me asking for test results.  I explained that only one of the varicella zoster tests was done and then noted that I had called and left her a message on 5/18 because the test could not be completed.  She has not gone to follow up with pcp and [facility].  I advised her to call pcp and ask for testing there if she still would like it.

## 2016-07-04 ENCOUNTER — Emergency Department
Admission: EM | Admit: 2016-07-04 | Discharge: 2016-07-04 | Disposition: A | Discharge status: Home / Self Care | Payer: Managed Care, Other (non HMO) | Attending: Emergency Medicine | Admitting: Emergency Medicine

## 2016-07-04 ENCOUNTER — Encounter: Payer: Self-pay | Admitting: Emergency Medicine

## 2016-07-04 ENCOUNTER — Emergency Department: Payer: Managed Care, Other (non HMO)

## 2016-07-04 DIAGNOSIS — Z79899 Other long term (current) drug therapy: Secondary | ICD-10-CM | POA: Insufficient documentation

## 2016-07-04 DIAGNOSIS — Z7984 Long term (current) use of oral hypoglycemic drugs: Secondary | ICD-10-CM | POA: Diagnosis not present

## 2016-07-04 DIAGNOSIS — M25572 Pain in left ankle and joints of left foot: Secondary | ICD-10-CM | POA: Diagnosis present

## 2016-07-04 DIAGNOSIS — F17210 Nicotine dependence, cigarettes, uncomplicated: Secondary | ICD-10-CM | POA: Diagnosis not present

## 2016-07-04 DIAGNOSIS — Z7982 Long term (current) use of aspirin: Secondary | ICD-10-CM | POA: Insufficient documentation

## 2016-07-04 DIAGNOSIS — M25551 Pain in right hip: Secondary | ICD-10-CM

## 2016-07-04 DIAGNOSIS — M25552 Pain in left hip: Secondary | ICD-10-CM | POA: Diagnosis not present

## 2016-07-04 DIAGNOSIS — E1140 Type 2 diabetes mellitus with diabetic neuropathy, unspecified: Secondary | ICD-10-CM | POA: Insufficient documentation

## 2016-07-04 DIAGNOSIS — M7918 Myalgia, other site: Secondary | ICD-10-CM

## 2016-07-04 DIAGNOSIS — M791 Myalgia: Secondary | ICD-10-CM | POA: Insufficient documentation

## 2016-07-04 MED ORDER — BACLOFEN 10 MG PO TABS: 10.0000 mg | ORAL_TABLET | Freq: Three times a day (TID) | ORAL | 0 refills | Status: AC

## 2016-07-04 MED ORDER — OXYCODONE HCL 5 MG PO TABS
5.0000 mg | ORAL_TABLET | Freq: Three times a day (TID) | ORAL | 0 refills | Status: AC | PRN
Start: 2016-07-04 — End: 2017-07-04

## 2016-07-04 MED ORDER — KETOROLAC TROMETHAMINE 30 MG/ML IJ SOLN
30.0000 mg | Freq: Once | INTRAMUSCULAR | Status: AC
  Administered 2016-07-04: 30 mg via INTRAMUSCULAR
  Filled 2016-07-04: qty 1

## 2016-07-04 NOTE — ED Triage Notes (Signed)
Pt to ED via POV, pt states that she fell on Wednesday in her bath tub. Pt denies hitting head or LOC. Pt states that she is having pain in her back, bilateral hips, and swelling in her left ankle. Pt is in NAD at this time.

## 2016-07-04 NOTE — ED Provider Notes (Signed)
[facility] Emergency Department Provider Note ____________________________________________  Time seen: Approximately 10:22 AM  I have reviewed the triage vital signs and the nursing notes.   HISTORY  Chief Complaint Fall    HPI Mary Cobb is a 56 y.o. female who presents to the emergency department for evaluation of back pain, bilateral hip pain, and left ankle pain after falling in her bathtub 2 days ago.Patient reports a mechanical, non-syncopal slip and fall while attempting to get out of her bathtub. She states that she landed on her back and right hip. Since that time, pain has increased and is not relieved with her gabapentin. She has a significant past medical history of osteoporosis and rheumatoid arthritis. She states that she has had to alter her gait due to the pain on the right hip and feels that she has some increased in inflammation in the left ankle. She has not taken any medications outside of those prescribed.  Past Medical History:  Diagnosis Date  . Diabetes mellitus without complication (HCC)   . Neuropathy   . Osteoporosis   . Rheumatoid arthritis (HCC)     There are no active problems to display for this patient.   History reviewed. No pertinent surgical history.  Prior to Admission medications   Medication Sig Start Date End Date Taking? Authorizing Provider  aspirin EC 81 MG tablet Take 81 mg by mouth daily.    [provider]  atorvastatin (LIPITOR) 10 MG tablet Take 10 mg by mouth daily.    [provider]  baclofen (LIORESAL) 10 MG tablet Take 1 tablet (10 mg total) by mouth 3 (three) times daily. 07/04/16   Kacyn Souder, Rulon Eisenmengerari B, FNP  folic acid (FOLVITE) 1 MG tablet Take 1 mg by mouth daily.    [provider]  gabapentin (NEURONTIN) 300 MG capsule Take 1 capsule (300 mg total) by mouth 3 (three) times daily. 05/22/16 05/22/17  Merrily Brittleifenbark, Neil, MD  HYDROcodone-acetaminophen (NORCO) 5-325 MG tablet Take  1 tablet by mouth every 6 (six) hours as needed for severe pain. 05/22/16   Merrily Brittleifenbark, Neil, MD  hydroxychloroquine (PLAQUENIL) 200 MG tablet Take 400 mg by mouth daily.    [provider]  metFORMIN (GLUCOPHAGE) 500 MG tablet Take 500 mg by mouth 2 (two) times daily with a meal.    [provider]  methotrexate 2.5 MG tablet Take 2.5 mg by mouth 3 (three) times a week.    [provider]  oxyCODONE (ROXICODONE) 5 MG immediate release tablet Take 1 tablet (5 mg total) by mouth every 8 (eight) hours as needed. 07/04/16 07/04/17  Natahsa Marian, Kasandra Knudsenari B, FNP  predniSONE (DELTASONE) 2.5 MG tablet Take 2.5 mg by mouth daily with breakfast.    [provider]    Allergies Patient has no known allergies.  No family history on file.  Social History Social History  Substance Use Topics  . Smoking status: Current Every Day Smoker    Packs/day: 1.50  . Smokeless tobacco: Never Used  . Alcohol use No    Review of Systems Constitutional: Negative for weakness Cardiovascular: Negative for chest pain Respiratory: Negative for shortness of breath Musculoskeletal: Positive for right hip pain, mid back pain, and left ankle pain. Skin: Negative for lesion or wound.  Neurological: Negative for loss of bowel or bladder control, negative for paresthesias or radiculopathy.  ____________________________________________   PHYSICAL EXAM:  VITAL SIGNS: ED Triage Vitals  Enc Vitals Group     BP 07/04/16 0918 116/71  Pulse Rate 07/04/16 0918 95     Resp 07/04/16 0918 16     Temp 07/04/16 0918 98.6 F (37 C)     Temp Source 07/04/16 0918 Oral     SpO2 07/04/16 0918 99 %     Weight 07/04/16 0919 145 lb (65.8 kg)     Height --      Head Circumference --      Peak Flow --      Pain Score 07/04/16 0918 7     Pain Loc --      Pain Edu? --      Excl. in GC? --     Constitutional: Alert and oriented. Well appearing and in no acute distress. Eyes: EOMI. PERRLA.  Conjunctivae are clear without discharge or drainage.  Head: Atraumatic. Neck: Nexus criteria is negative. Respiratory: Breath sounds are clear with respirations even and unlabored. Musculoskeletal: No midline tenderness of the cervical spine, diffuse thoracic tenderness midline and paraspinal on light palpation, no focal midline lumbar spine tenderness on exam. Right hip pain on attempt to flex the right knee with instant guarding, hip pain immediately with attempt to externally rotate. Left hip pain with flexion of the left knee without guarding, pain does not increase with external rotation. Left ankle mild edema and erythema over the lateral malleolus. No tenderness over the ATFL pattern. No increase in tenderness with flexion and extension of the foot. Neurologic: Alert and oriented 4.  Skin: Skin over the left lateral malleolus is mildly erythematous and warm to touch.  Psychiatric: Behavior and affect are appropriate.  ____________________________________________   LABS (all labs ordered are listed, but only abnormal results are displayed)  Labs Reviewed - No data to display ____________________________________________  RADIOLOGY  Bilateral hip with pelvis films are negative for acute bony abnormality per radiology. Left ankle images also negative for acute bony abnormality per radiology. Pelvic CT without contrast is negative for acute bony abnormality.  ____________________________________________   PROCEDURES  Procedure(s) performed: None  ____________________________________________   INITIAL IMPRESSION / ASSESSMENT AND PLAN / ED COURSE  Mary Cobb is a 56 y.o. female who presents to the emergency department for evaluation 2 days after sustaining a mechanical, non-syncopal fall in her bathtub. Due to her osteoporosis and pain, x-rays will be obtained of the thoracic spine, bilateral hips, and left ankle. She'll be given an injection of Toradol while here. Patient  is aware of the plan and agrees to this treatment.  12:13 PM  results of CT and plain films discussed with the patient. She will be discharged home with a prescription for baclofen and Roxicet IR 5 mg. She was encouraged to follow-up with her primary care provider on Monday if pain is not improving. She was encouraged to return to the emergency department for symptoms that change or worsen if she's unable schedule an appointment.  Pertinent labs & imaging results that were available during my care of the patient were reviewed by me and considered in my medical decision making (see chart for details).  _________________________________________   FINAL CLINICAL IMPRESSION(S) / ED DIAGNOSES  Final diagnoses:  Hip pain, acute, right  Hip pain, acute, left  Musculoskeletal pain    Discharge Medication List as of 07/04/2016 12:13 PM    START taking these medications   Details  baclofen (LIORESAL) 10 MG tablet Take 1 tablet (10 mg total) by mouth 3 (three) times daily., Starting Fri 07/04/2016, Print    oxyCODONE (ROXICODONE) 5 MG immediate release tablet Take  1 tablet (5 mg total) by mouth every 8 (eight) hours as needed., Starting Fri 07/04/2016, Until Sat 07/04/2017, Print        If controlled substance prescribed during this visit, 12 month history viewed on the [facility] prior to issuing an initial prescription for Schedule II or III opiod.    Chinita Pester, FNP 07/04/16 1705    Nita Sickle, MD 07/09/16 1106

## 2016-07-04 NOTE — Discharge Instructions (Signed)
Please follow up with your primary care provider or rheumatologist for symptoms that are not improving over the next few days. Take you medications as directed along with your gabapentin. Return to the ER for symptoms that change or worsen if unable to schedule an appointment with primary care or the specialist.

## 2016-07-04 NOTE — ED Notes (Signed)
Patient transported to X-ray 

## 2016-07-11 LAB — VARICELLA ZOSTER ANTIBODY, IGG: VARICELLA IGG: 3639 {index} (ref >165)

## 2016-08-06 NOTE — Unmapped (Signed)
PT IS OFF OF ENBREL UNTIL FURTHER NOTICE DUE TO SHINGLES. WE WILL CALL AGAIN IN 1 MONTH TO FOLLOW UP.

## 2016-09-10 MED ORDER — METHOTREXATE SODIUM 2.5 MG TABLET
ORAL_TABLET | ORAL | 0 refills | 0.00000 days | Status: CP
Start: 2016-09-10 — End: 2016-10-03

## 2016-09-10 NOTE — Unmapped (Signed)
Per Dr Pt is not taking medication and could be restarting at the end of the month. Will set up follow up phone call for the first of October.

## 2016-09-10 NOTE — Unmapped (Signed)
Pt was called on 09-10-2016 to see if she had done any lab work since March 2018-and she has not. She chooses to come for her appt on 10-03-2016 and get labs at that time. She has enough meds for another week.

## 2016-09-15 NOTE — Unmapped (Signed)
Pa approved for enbrel for $300 until 03/12/17

## 2016-09-24 MED ORDER — GABAPENTIN 400 MG CAPSULE
ORAL_CAPSULE | Freq: Three times a day (TID) | ORAL | 2 refills | 0 days | Status: CP
Start: 2016-09-24 — End: 2016-12-22

## 2016-09-24 NOTE — Unmapped (Signed)
Script for gabapentin sent to pharmacy on file on 09-24-2016. Pt last seen on 06-18-2016 and has a f/u on 9-28 and 10-20-16.

## 2016-10-03 ENCOUNTER — Ambulatory Visit
Admission: RE | Admit: 2016-10-03 | Discharge: 2016-10-03 | Disposition: A | Discharge status: Home / Self Care | Payer: Commercial Managed Care - PPO

## 2016-10-03 DIAGNOSIS — M059 Rheumatoid arthritis with rheumatoid factor, unspecified: Principal | ICD-10-CM

## 2016-10-03 DIAGNOSIS — Z79899 Other long term (current) drug therapy: Secondary | ICD-10-CM

## 2016-10-03 DIAGNOSIS — M810 Age-related osteoporosis without current pathological fracture: Secondary | ICD-10-CM

## 2016-10-03 LAB — CBC W/ AUTO DIFF
EOSINOPHILS ABSOLUTE COUNT: 0.6 10*9/L — ABNORMAL HIGH (ref 0.0–0.4)
HEMATOCRIT: 39.3 % (ref 36.0–46.0)
HEMOGLOBIN: 13 g/dL (ref 12.0–16.0)
LARGE UNSTAINED CELLS: 1 % (ref 0–4)
LYMPHOCYTES ABSOLUTE COUNT: 2.7 10*9/L (ref 1.5–5.0)
MEAN CORPUSCULAR HEMOGLOBIN CONC: 33 g/dL (ref 31.0–37.0)
MEAN CORPUSCULAR HEMOGLOBIN: 29.9 pg (ref 26.0–34.0)
MEAN CORPUSCULAR VOLUME: 90.5 fL (ref 80.0–100.0)
MEAN PLATELET VOLUME: 6.6 fL — ABNORMAL LOW (ref 7.0–10.0)
MONOCYTES ABSOLUTE COUNT: 0.5 10*9/L (ref 0.2–0.8)
NEUTROPHILS ABSOLUTE COUNT: 5.7 10*9/L (ref 2.0–7.5)
RED CELL DISTRIBUTION WIDTH: 14.9 % (ref 12.0–15.0)
WBC ADJUSTED: 9.6 10*9/L (ref 4.5–11.0)

## 2016-10-03 LAB — AST (SGOT): Aspartate aminotransferase:CCnc:Pt:Ser/Plas:Qn:: 16

## 2016-10-03 LAB — CREATININE
CREATININE: 0.46 mg/dL — ABNORMAL LOW (ref 0.60–1.00)
Creatinine:MCnc:Pt:Ser/Plas:Qn:: 0.46 — ABNORMAL LOW
EGFR MDRD AF AMER: 170 mL/min/{1.73_m2} (ref >=60)

## 2016-10-03 LAB — CALCIUM: Calcium:MCnc:Pt:Ser/Plas:Qn:: 9.4

## 2016-10-03 LAB — ALT (SGPT): Alanine aminotransferase:CCnc:Pt:Ser/Plas:Qn:: 14 — ABNORMAL LOW

## 2016-10-03 LAB — ALBUMIN: Albumin:MCnc:Pt:Ser/Plas:Qn:: 4.4

## 2016-10-03 LAB — BASOPHILS ABSOLUTE COUNT: Lab: 0.1

## 2016-10-03 MED ORDER — METHOTREXATE SODIUM 2.5 MG TABLET
ORAL_TABLET | ORAL | 3 refills | 0.00000 days | Status: CP
Start: 2016-10-03 — End: 2017-02-15

## 2016-10-03 NOTE — Unmapped (Signed)
Specialty Pharmacy - [facility] Rheumatology Telephone Call     Ashley Sellers is a 56 y.o. female contacted today regarding assessment and refill of her specialty medication(s): Enbrel.  Patient was holding off of Enbrel the past few months due to shingles.  She saw Ashley Sellers in clinic 10/03/16 and plan to resume Enbrel due to active RA. Please refer to Ashley Sellers notes on 09/02/16 for clinical assessment and further details.  Ashley Sellers restarted on Enbrel on 10/03/16 and has 3 doses left from that box.      Reviewed and verified with patient: Allergies - Medications -      Specialty medication(s) and dose(s) confirmed: yes  Changes to medications: no    Does Ashley Sellers have follow up appointment scheduled with clinic? Yes, appointment is scheduled and patient is aware    All questions were answered and contact information provided for any future questions/concerns.      Jeneen Montgomery

## 2016-10-03 NOTE — Unmapped (Signed)
REASON FOR VISIT: f/u RA    HISTORY: Ashley Sellers is a 56 y.o. female with seropositive erosive RA. She has previously been treated with only longer term prednisone due to difficulty with travel and poor compliance to lab monitoring. She was started on HCQ in 2014, started mtx in 12/2014. She has been tapered off prednisone. Enbrel started around 03/2016 for persistent disease activity.  She also has a hx of osteoporosis on DEXA from 2015. She started fosamax in 2016.     She stopped enbrel in the setting of concern for shingles in 05/2016.     Interim history:  Presents today for f/u.     She continues to have pain on the left flank radiating of the left breast. She stills never developed any skin lesions. Now developing left upper quadrant abdominal pain, stabbing, intermittent, not associated with anything she is eating. She is following up with her PCP for this.  Went to the ER in the interim to have testing done for shingles. She has a friend that works at [employer] and gave her the codes for blood tests to tell if her pain was the shingles. She had the blood tests drawn, but one of the specimens was sent incorrectly so they could not tell if she had the shingles or not. I have encouraged her to f/u with her PCP regarding this.   With respect to pain, she has not really been using the lidoderm patches, because the pain is not over her back. The pain is over the side and under the breast. She has been using gabapentin TID with some improvement in pain.     She states I gotta get back on that enbrel. She reports she had significant improvement in her pain and stiffness when taking the Enbrel. She did she took this for about 4-5 doses before coming down with shingles. She fell essential resolution of her morning stiffness while on the Enbrel. She tolerated it well until she got shingles. She thinks she may just start taking the Enbrel again and see if it makes the shingles pain worse or causes any shingles-type rashes.  Continues methotrexate and sulfasalazine without missed doses.    CURRENT MEDICATIONS:  Current Outpatient Prescriptions   Medication Sig Dispense Refill   ??? alendronate (FOSAMAX) 70 MG tablet Take 1 tablet (70 mg total) by mouth every seven (7) days. 4 tablet 11   ??? amitriptyline (ELAVIL) 25 MG tablet TAKE 1 TABLET BY MOUTH EVERY EVENING FOR 1 WEEK, THEN INCREASE TO 2 TABLETS EVERY EVENING 180 tablet 2   ??? aspirin (ECOTRIN) 81 MG tablet Take 1 tablet (81 mg total) by mouth daily. 150 tablet 2   ??? atorvastatin (LIPITOR) 40 MG tablet TAKE 1 TABLET (40 MG TOTAL) BY MOUTH DAILY. 30 tablet 8   ??? blood sugar diagnostic (CONTOUR TEST STRIPS) Strp by Other route daily. Use to test blood sugar every morning before breakfast.  ICD 9 250.00 50 strip 11   ??? blood-glucose meter Misc 1 Device by Miscellaneous route daily. for 1 day 1 each 0   ??? calcium carbonate-vitamin D2 500 mg(1,250mg ) -200 unit tablet Take 1 tablet by mouth Two (2) times a day.     ??? etanercept (ENBREL) injection 50 mg/mL PEN Inject 1 mL (50 mg total) under the skin once a week. 12 pen 3   ??? gabapentin (NEURONTIN) 400 MG capsule TAKE 1 CAPSULE (400 MG TOTAL) BY MOUTH THREE (3) TIMES A DAY. 90 capsule 2   ???  glipiZIDE (GLUCOTROL XL) 10 MG 24 hr tablet Take 1 tablet (10 mg total) by mouth daily. 90 tablet 3   ??? hydroxychloroquine (PLAQUENIL) 200 mg tablet Take 1 tablet (200 mg total) by mouth Two (2) times a day. 180 tablet 3   ??? leucovorin (WELLCOVORIN) 5 mg tablet Take 3 tablets (15 mg total) by mouth every seven (7) days. Take 12-24 hours after methotrexate 36 tablet 3   ??? lidocaine (LIDODERM) 5 % patch Apply to affected area for 12 hours only each day (then remove patch) 30 patch 2   ??? metFORMIN (GLUCOPHAGE) 1000 MG tablet Take 1 tablet (1,000 mg total) by mouth 2 (two) times a day with meals. 180 tablet 3   ??? methotrexate 2.5 MG tablet TAKE 8 TABLETS (20 MG TOTAL) BY MOUTH ONCE A WEEK. 32 tablet 0     No current facility-administered medications for this visit.      Past Medical History:   Diagnosis Date   ??? Diabetes mellitus (CMS-HCC)    ??? High cholesterol    ??? Osteoporosis    ??? Rheumatoid aortitis          Record Review: Available records were reviewed, including pertinent office visits, labs, and imaging.     REVIEW OF SYSTEMS: Ten system were reviewed and negative except as noted above.    PHYSICAL EXAM:  VITAL SIGNS:   Vitals:    10/03/16 1413   BP: 147/78   Pulse: 83   Resp: 18   Temp: 35.8 ??C (96.4 ??F)   TempSrc: Oral   Weight: 61.1 kg (134 lb 9.6 oz)   Height: 160 cm (5' 3)     General:   Pleasant 56 y.o.female in no acute distress, WDWN   Eyes:   PERRL, conjunctiva and sclera not inflamed. Tears appear adequate.    ENT:   No oropharyngeal lesions. Mucous membranes moist.  Edentulous   Lymph:   No masses or cervical lymphadenopathy.    Cardiovascular:  Regular rate and rhythm. No murmur, rub, or gallop. No lower extremity edema.    Lungs:  Clear to auscultation.Normal respiratory effort.    Musculoskeletal:   General: Ambulates w/o assistance   Hands:Nodules on left third PIP with tenderness. No swelling b/l.  Able to make a tight fist bilaterally.  Wrists: Left wrist is warm and mildly swollen, with reduced range of motion. R wrist slightly warm with reduced ROM.   Elbows: Full range motion bilaterally. Large nodules on R  Shoulders: Full range motion bilaterally with pain   Hips: Full flexion  Knees: Nodules over the right knee. Full range motion without effusion. Crepitus on range motion bilaterally.  Ankles: Nodules over Achilles tendon on the left and lateral malleolus on the left. No swelling or tenderness.  Feet: No pain with MTP squeeze. Bunionette formation on the right.    Neurological:  CN 2-12 grossly intact. 5/5 strength on extremities.   Psych:  Appropriate affect and mood   Skin:  No rashes.             ASSESSMENT/PLAN:  1. Seropositive rheumatoid arthritis (RAF-HCC)  I have encouraged her to continue follow-up with her PCP for this flank and abdominal pain. It is still unclear to me if this is related to shingles. If her PCP feels that the shingles has cleared and she has only postherpetic neuralgia, she could resume Enbrel. We discussed risks of using Enbrel during active shingles to include systemic zoster infection.  Continue methotrexate 20 mg  every week, leucovorin, Plaquenil 200 mg twice a day.    2. Other osteoporosis without current pathological fracture (RAF-HCC)  On fosamax since 2016. Order updated dexa bone scan today.       3. Methotrexate, long term, current use  Checking labs below to evaluate for medication toxicity.    - Albumin; Future  - AST; Future  - ALT; Future  - CBC w/ Differential; Future  - Creatinine; Future    >25 min spent in consultation with pt, >50% of which was spent discussing diagnosis and treatment options.   RTC 1 mo as scheduled with Dr Renford Dills

## 2016-10-06 LAB — VITAMIN D, TOTAL (25OH): Lab: 21.2

## 2016-10-06 NOTE — Unmapped (Signed)
-----   Message from Staci Righter, [place] sent at 10/06/2016  4:43 PM EDT -----  Please let pt know her vitamin D is still a little low. Increase over the counter vitamin D to 5,000 units daily. She may need to take an extra vitamin D pill in addition to her calcium+vit D combination pill to achieve this dose.

## 2016-10-06 NOTE — Unmapped (Signed)
Called and spoke with the patient, updated on vitamin D level and recommendation to increase vit D intake to 5000 un daily and addition of Calcium with vit D, patient verbalized understanding by repeating back.

## 2016-10-22 ENCOUNTER — Ambulatory Visit
Admission: RE | Admit: 2016-10-22 | Discharge: 2016-10-22 | Disposition: A | Discharge status: Home / Self Care | Payer: Commercial Managed Care - PPO

## 2016-10-22 DIAGNOSIS — M810 Age-related osteoporosis without current pathological fracture: Principal | ICD-10-CM

## 2016-10-22 NOTE — Unmapped (Signed)
Ashley Sellers is a 56 y.o. female contacted today regarding refill of her specialty medication(s): ENBREL     Reviewed and verified with patient:      Specialty medication(s) and dose(s) confirmed: yes  Changes to medications: no  Changes to insurance: no     Medication Adherence    Patient Reported X Missed Doses in the Last Month:  0  Specialty Medication:  ENBREL  Medication Assistance Program  Refill Coordination  Has the Patients' Contact Information Changed:  No    Is the Shipping Address Different:  No    Shipping Information  Delivery Scheduled:  Yes  Delivery Date:  11/05/16  Medications to be Shipped:  ENBREL DELIVER 11/05/16 INJECT 11/07/16       Next dose of ENBREL from this shipment due on 11/07/16.       All questions were answered and contact information provided for any future questions/concerns.      Roselyn Meier

## 2016-11-03 MED FILL — ENBREL SURCLICK/50MG/mL/PEN: ENBREL SURCLICK/50MG/mL/PEN | 28 days supply | Qty: 1 | Fill #3

## 2016-11-10 MED ORDER — ABALOPARATIDE 80 MCG/DOSE(3,120 MCG/1.56 ML) SUBCUTANEOUS PEN INJECTOR
Freq: Every day | SUBCUTANEOUS | 3 refills | 0.00000 days | Status: CP
Start: 2016-11-10 — End: 2016-11-10

## 2016-11-10 MED ORDER — ABALOPARATIDE 80 MCG/DOSE(3,120 MCG/1.56 ML) SUBCUTANEOUS PEN INJECTOR: mL | 3 refills | 0 days

## 2016-11-11 NOTE — Unmapped (Signed)
Called pt to discuss results of dexa bone scan which are worse than previous despite use of fosamax.   DXA results 2015: Tscore Lspine -2.5 and femur neck -2.2  DXA results 2018: Tscore Lspine -2.9 and femur neck -2.5    Given these worsening scores, would benefit from change in MOA. Particularly in the setting of RA and long term exposure to prednisone, recommend anabolic therapy x 18 months followed by IV reclast x 3 yrs. Discussed risks of use of anabolic therapy, and she verbalizes understanding and wishes to proceed with therapy.   Tymlos sent to [facility] shared services pharmacy.     Prior Authorization Request Clinical Information and Justification  Urgency: Routine     1. Diagnosis/ICD-10: osteoporosis  2. Requested Medication: Tymlos    Patient: Ashley Sellers, DOB: [birth year], MRN: [id number]    Prescribing Physician/NPI:  Carlus Pavlov, [id number]    Attending Physician/NPI: Dr. Lattie Haw [id number]    Is this medication a new start or a continuation of therapy? New start    Signature waiver form not obtained at this time.    Clinical Rationale: Worsening dexa bone scan despite use of fosamax, hx of RA and long term prednisone use    Other Agents Tried: fosamax    Relevant Labs: see labs     Thank you,  Lehi Phifer Royetta Asal

## 2016-11-17 NOTE — Unmapped (Signed)
Pa approved for enbrel for $300 until 03/12/17    Per test claim for Tymlos at the [facility], approved for $4 with copay card.

## 2016-11-17 NOTE — Unmapped (Addendum)
[facility] Onboarding and Refill Note    Specialty Medication(s): TYMLOS, Enbrel  Indication(s): osteoporosis and RA      Ashley Sellers, DOB: Nov 04, 1960  Phone: [phone] (home)   Shipping Address: [address]  [place] [address]  All above HIPAA information was verified with patient.     Completed refill call assessment today for Enbrel to schedule patient's medication shipment from the [facility] [phone]).    Patient is starting on Tymlos for the treatment of osteoporosis.  She prefers to come to clinic for counseling and first dose injection.  Clinic appt will be scheduled for Firday 12/04/16 @ 3PM.    Pen needles were not sent with original order, new orders sent to [facility].      Medications reviewed and verified: Allergies - Medications -      Specialty medication(s) and dose(s) confirmed: yes  Changes to medications: no   Changes to insurance: no  Tolerating medications:   Adverse Effects    *All other systems reviewed and are negative          ADHERENCE     Medication Adherence    Patient Reported X Missed Doses in the Last Month:  0  Specialty Medication:  ENBREL  Informant:  patient  Provider-Estimated Medication Adherence Level:  good  Medication Assistance Program  Refill Coordination  Has the Patients' Contact Information Changed:  No    Is the Shipping Address Different:  No    Shipping Information  Delivery Scheduled:  Yes  Delivery Date:  11/26/16  Medications to be Shipped:  ENBREL, TYMLOS, PEN NEEDLES, SHARPS CONTAINER         SHIPPING     Delivery Scheduled: yes, Expected medication delivery date: 11/26/16  Next dose of Enbrel from this shipment due on 11/29.  First dose of Tymlos expected to be given on 12/04/16 during clinic appointment for counseling/inj training.    All questions were answered and contact information provided for any future questions/concerns.      Jeneen Montgomery

## 2016-11-18 MED ORDER — PEN NEEDLE, DIABETIC 32 GAUGE X 5/32" (4 MM)
0 refills | 0 days
Start: 2016-11-18 — End: 2017-11-18

## 2016-11-18 MED ORDER — PEN NEEDLE, DIABETIC 31 GAUGE X 3/16" (5 MM)
0 refills | 0 days | Status: CP
Start: 2016-11-18 — End: ?

## 2016-11-18 NOTE — Unmapped (Signed)
Addended by: Geoffry Paradise T on: 11/18/2016 09:17 AM     Modules accepted: Orders

## 2016-11-25 MED FILL — UNIFINE PENTIPS 32GX4MM/32GX4MM/MISC: UNIFINE PENTIPS 32GX4MM/32GX4MM/MISC | 90 days supply | Qty: 100 | Fill #0

## 2016-11-25 MED FILL — ENBREL SURCLICK/50MG/mL/PEN: ENBREL SURCLICK/50MG/mL/PEN | 28 days supply | Qty: 1 | Fill #4

## 2016-11-25 MED FILL — TYMLOS/2000MCG/ML/SOAJ: TYMLOS/2000MCG/ML/SOAJ | 30 days supply | Qty: 1 | Fill #0

## 2016-11-25 MED FILL — SHARPS KIT/NA/MISC: SHARPS KIT/NA/MISC | 120 days supply | Qty: 1 | Fill #0

## 2016-12-04 ENCOUNTER — Ambulatory Visit
Admission: RE | Admit: 2016-12-04 | Discharge: 2016-12-04 | Disposition: A | Discharge status: Home / Self Care | Payer: Commercial Managed Care - PPO | Attending: Ambulatory Care | Admitting: Ambulatory Care

## 2016-12-04 DIAGNOSIS — M059 Rheumatoid arthritis with rheumatoid factor, unspecified: Principal | ICD-10-CM

## 2016-12-04 MED ORDER — HYDROXYCHLOROQUINE 200 MG TABLET
ORAL_TABLET | Freq: Two times a day (BID) | ORAL | 3 refills | 0 days | Status: CP
Start: 2016-12-04 — End: 2017-12-04

## 2016-12-04 NOTE — Unmapped (Signed)
Medication Counseling     Ashley Sellers is a 56 y.o. female being initiated on Tymlos pen 80 mcg subq once daily for osteoporosis.    - Current medications have been reviewed and assessed for possible interaction. Allergies verified.   - Reviewed the following:   Prescribed regimen: 80 mcg SQ once daily   Purpose    Common/serious side effects: Dizziness, fatigue, nausea, injection-site reactions, orthostatic hypotension, hypercalcemia, hypercalciuria   Monitoring parameters: Serum calcium; DEXA scan every 2 years  - Demonstrated injection techniques and patient felt comfortable with self-administration at home (she forgot to bring her medication to her visit today). Plans to start Tymlos today.   - Emphasized the importance of adherence to prescribed regimen, clinic follow-up visits, and laboratory testing.   - Patient will be receiving medication from the [facility] and will be contacted monthly for medication refill, quarterly for medication assessment  - Refill for Plaquenil sent today to local [facility] pharmacy per patient request  - Follow-up visit scheduled with Dr. Renford Dills on 03/13/2017     Patient verbalized understanding and contact information provided for any future questions/concerns.     Burlene Arnt, PharmD, BCPS  PGY-2 Ambulatory Care Resident

## 2016-12-18 NOTE — Unmapped (Signed)
[facility] Refill Coordination Note  Specialty Medication(s): Tymlos 205mcg/ml, Enbrel 50mg /ml Pen      Ashley Sellers, DOB: 03-08-1960  Phone: [phone] (home) , Alternate phone contact: N/A  Phone or address changes today?: No  All above HIPAA information was verified with patient.  Shipping Address: [address]  [place] [address]   Insurance changes? No    Completed refill call assessment today to schedule patient's medication shipment from the [facility] [phone]).      Confirmed the medication and dosage are correct and have not changed: Yes, regimen is correct and unchanged.    Confirmed patient started or stopped the following medications in the past month:  No, there are no changes reported at this time.    Are you tolerating your medication?:  Ashley Sellers reports tolerating the medication.    ADHERENCE    Is this medicine transplant or covered by Medicare Part B? No.        Did you miss any doses in the past 4 weeks? Yes.  Ashley Sellers reports missing 1 days of medication therapy in the last 4 weeks.  Ashley Sellers reports forgetting as the cause of their non-adherance.    FINANCIAL/SHIPPING    Delivery Scheduled: Yes, Expected medication delivery date: 12/25/16     Ashley Sellers did not have any additional questions at this time.    Delivery address validated in [facility] scheduling system: Yes, address listed in [facility]SI is correct.    We will follow up with patient monthly for standard refill processing and delivery.      Thank you,  [employer]   [facility] Specialty Pharmacist

## 2016-12-22 MED ORDER — GABAPENTIN 400 MG CAPSULE
ORAL_CAPSULE | Freq: Three times a day (TID) | ORAL | 2 refills | 0.00000 days | Status: CP
Start: 2016-12-22 — End: 2017-12-22

## 2016-12-22 NOTE — Unmapped (Signed)
Gabapentin refill  Next ov 03/13/2017.    Thanks!    Tiff

## 2016-12-24 MED FILL — TYMLOS/2000MCG/ML/SOAJ: TYMLOS/2000MCG/ML/SOAJ | 30 days supply | Qty: 1 | Fill #1

## 2016-12-24 MED FILL — ENBREL SURCLICK/50MG/mL/PEN: ENBREL SURCLICK/50MG/mL/PEN | 28 days supply | Qty: 1 | Fill #5

## 2017-02-02 MED FILL — ENBREL SURCLICK/50MG/mL/PEN: ENBREL SURCLICK/50MG/mL/PEN | 28 days supply | Qty: 1 | Fill #6

## 2017-02-02 NOTE — Unmapped (Signed)
[facility] Refill Coordination Note  Specialty Medication(s): Tymlos 2059mcg/ml and Enbrel 50mg /ml       Butler Denmark, DOB: [birth year]  Phone: [phone] (home) , Alternate phone contact: N/A  Phone or address changes today?: No  All above HIPAA information was verified with patient.  Shipping Address: [address]  [place] [address]   Insurance changes? No    Completed refill call assessment today to schedule patient's medication shipment from the [facility] [phone]).      Confirmed the medication and dosage are correct and have not changed: Yes, regimen is correct and unchanged.    Confirmed patient started or stopped the following medications in the past month:  No, there are no changes reported at this time.    Are you tolerating your medication?:  [place] reports tolerating the medication.    ADHERENCE      Did you miss any doses in the past 4 weeks? No missed doses reported.    FINANCIAL/SHIPPING    Delivery Scheduled: Yes, Expected medication delivery date: 02/04/2017     Katura did not have any additional questions at this time.    Delivery address validated in [facility]SI scheduling system: Yes, address listed in [facility]SI is correct.    We will follow up with patient monthly for standard refill processing and delivery.      Thank you,  Virgil Slinger  Anders Grant   [facility] Specialty Pharmacist

## 2017-02-09 MED ORDER — FOLIC ACID 1 MG TABLET
ORAL_TABLET | 3 refills | 0 days | Status: CP
Start: 2017-02-09 — End: ?

## 2017-02-09 NOTE — Unmapped (Signed)
Folic Acid refill  Last ov 38/75/6433  Next ov 03/13/2017

## 2017-02-16 MED ORDER — METHOTREXATE SODIUM 2.5 MG TABLET
ORAL_TABLET | ORAL | 3 refills | 0 days | Status: CP
Start: 2017-02-16 — End: ?

## 2017-02-16 NOTE — Unmapped (Signed)
Last seen on:  10/03/16.  Next visit is 03/13/17.  Labs last visit.

## 2017-02-17 MED ORDER — AMITRIPTYLINE 25 MG TABLET
ORAL_TABLET | Freq: Every evening | ORAL | 2 refills | 0 days | Status: CP
Start: 2017-02-17 — End: ?

## 2017-02-26 NOTE — Unmapped (Deleted)
Internal Medicine Clinic Visit    HPI: Ashley Sellers is a 57 year old female with a history of T2DM, Rheumatoid arthritis, osteoporosis who presents for ***. She was last seen in [facility] by Dr. Rolene Arbour 05/20/2016 for flank pain, later diagnosed with shingles from an urgent care treated with antivirals and gabapentin  ***  __________________________________________________________    Problem List:  Patient Active Problem List   Diagnosis   ??? Rheumatoid arthritis (CMS-HCC)   ??? Carpal tunnel syndrome of right wrist   ??? Corticosteroid-induced osteoporosis   ??? Diabetes (CMS-HCC)       Medications:  Reviewed in EPIC  __________________________________________________________    Physical Exam:   Vital Signs:  There were no vitals filed for this visit.    Gen: Well appearing, NAD  CV: RRR, no murmurs  Pulm: CTA bilaterally, no crackles or wheezes  Abd: Soft, NTND, normal BS. No HSM.  Ext: No edema  ***  ____________________________________________________________    Assessment & Plan:  T2DM: 6.3% (05/2016)  - Foot exam:  - Urine albumin:cr  - Glipizide  - Metformin 1000 mg BID    Osteoporosis: Per Rheumatology, abaloparatide.     HCM  Cancer screening:   -Colon cancer: 2016, repeat in 5 years 2021  -Breast cancer: 03/2017- order ***  -Cervical cancer: total hysterectomy  -Lung cancer (smoker): *** discuss  Immunizations:  - Influenza  Labs:  - Lipids:  - HbA1c:  - HCV:  - HIV

## 2017-03-06 DEATH — deceased

## 2018-04-08 IMAGING — CR DG HIP (WITH OR WITHOUT PELVIS) 2-3V*L*
3 series · 3 of 3 positions shown · non-contrast
Comparison: Right hip radiographs obtained at the same time.

CLINICAL DATA: Bilateral hip pain after falling in her bathtub 2
days ago.

EXAM:
DG HIP (WITH OR WITHOUT PELVIS) 2-3V LEFT

[hip ap]
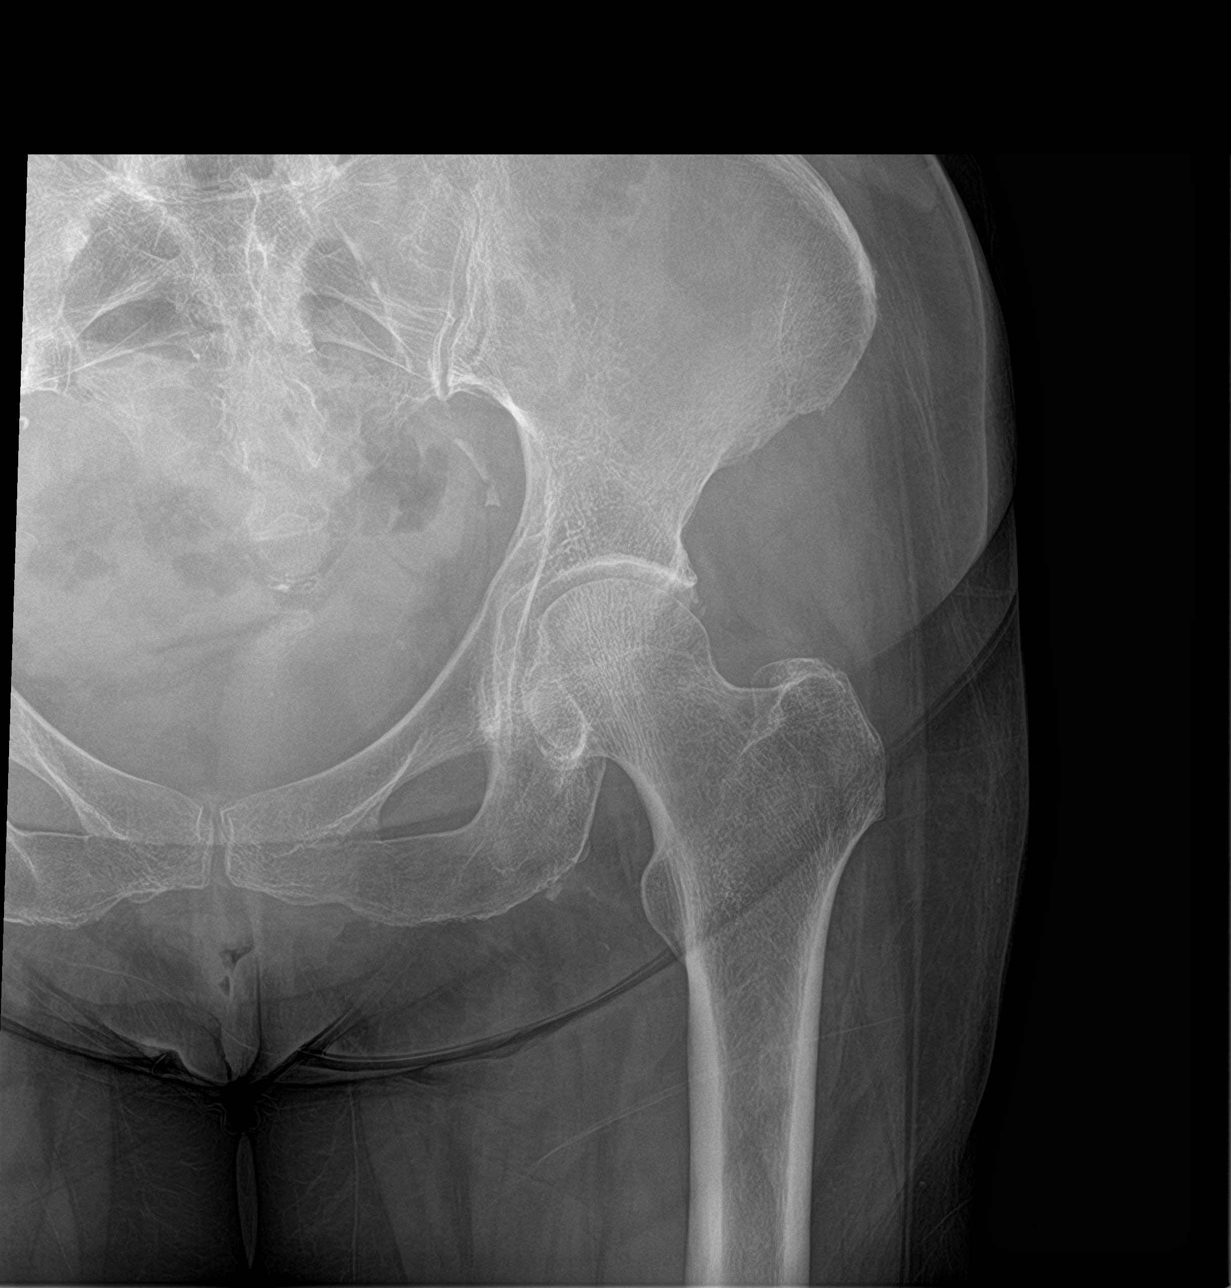

[hip lat]
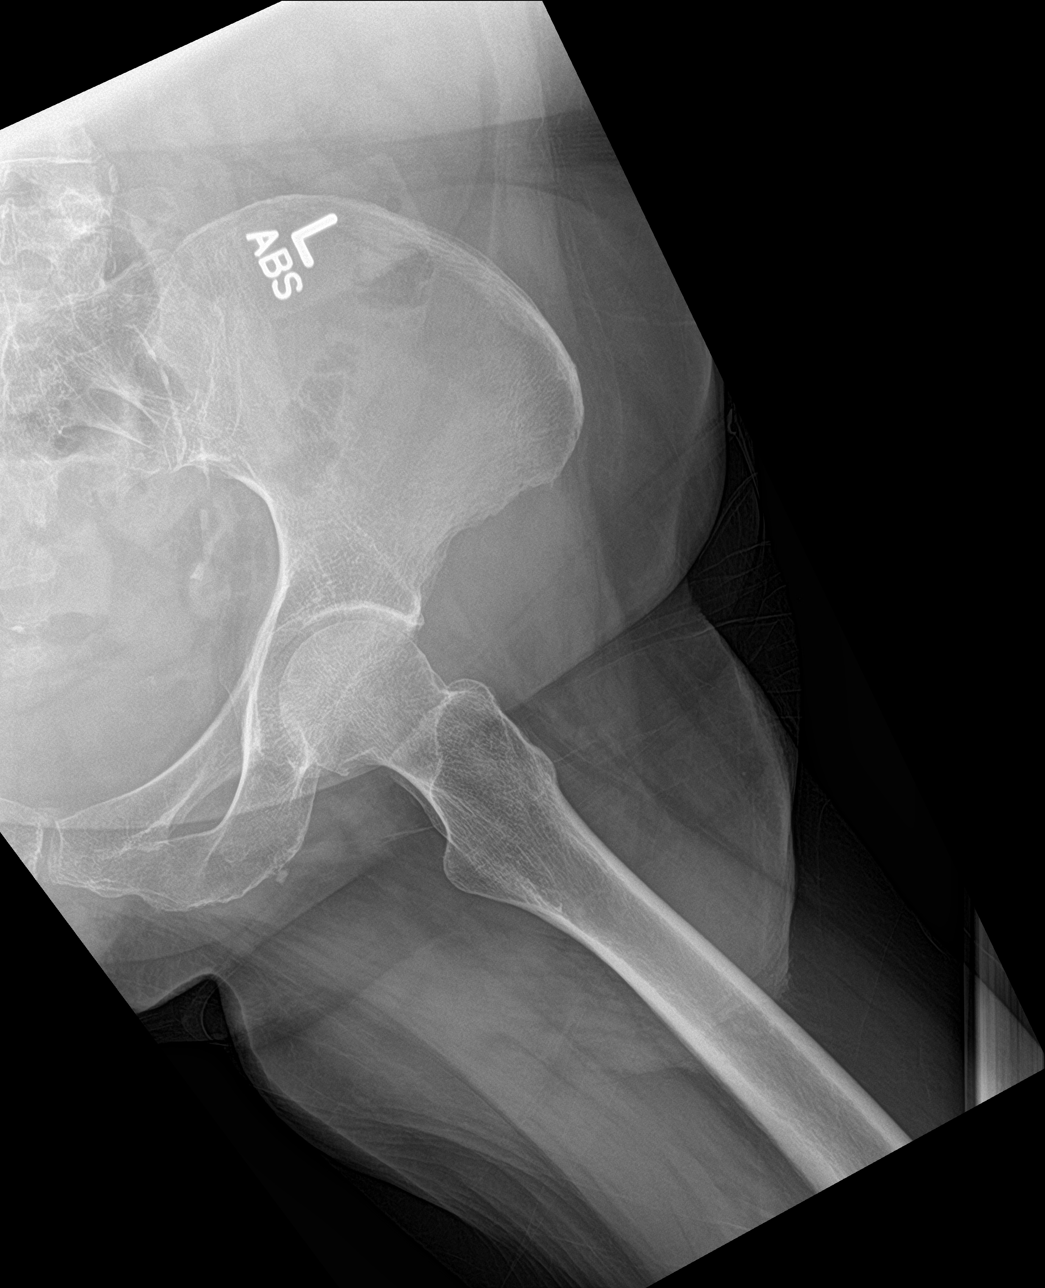

[pelvis ap]
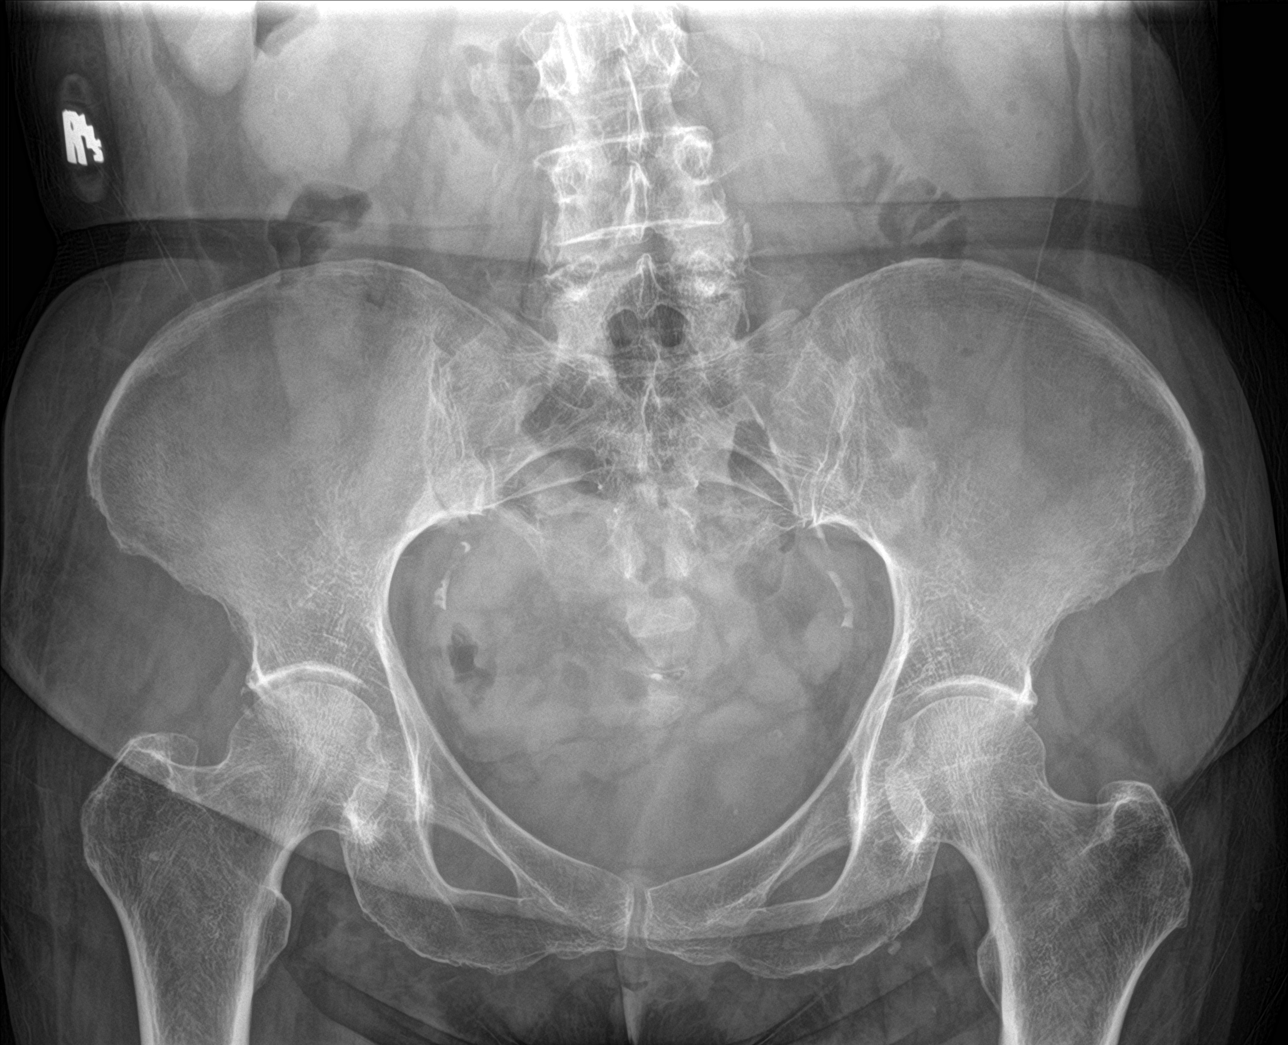

[3 of 3 positions shown; findings below may reference images not displayed]

FINDINGS: Normal appearing hips without fracture or dislocation. Atheromatous
arterial calcifications. Mild lower lumbar spine scoliosis.
IMPRESSION: No fracture or dislocation.
